# Patient Record
Sex: Male | Born: 1990 | Hispanic: Yes | Marital: Single | Smoking: Never smoker
Health system: Southern US, Community
[De-identification: ages and names within clinical notes are randomized; demographics above are authoritative.]

## PROBLEM LIST (undated history)

## (undated) DIAGNOSIS — T7840XA Allergy, unspecified, initial encounter: Secondary | ICD-10-CM

## (undated) HISTORY — DX: Allergy, unspecified, initial encounter: T78.40XA

---

## 2013-08-11 ENCOUNTER — Emergency Department (HOSPITAL_COMMUNITY)
Admission: EM | Admit: 2013-08-11 | Discharge: 2013-08-11 | Disposition: A | Discharge status: Home / Self Care | Payer: Self-pay | Attending: Emergency Medicine | Admitting: Emergency Medicine

## 2013-08-11 ENCOUNTER — Encounter (HOSPITAL_COMMUNITY): Payer: Self-pay

## 2013-08-11 DIAGNOSIS — IMO0002 Reserved for concepts with insufficient information to code with codable children: Secondary | ICD-10-CM | POA: Insufficient documentation

## 2013-08-11 DIAGNOSIS — X503XXA Overexertion from repetitive movements, initial encounter: Secondary | ICD-10-CM | POA: Insufficient documentation

## 2013-08-11 DIAGNOSIS — Y929 Unspecified place or not applicable: Secondary | ICD-10-CM | POA: Insufficient documentation

## 2013-08-11 DIAGNOSIS — Y990 Civilian activity done for income or pay: Secondary | ICD-10-CM | POA: Insufficient documentation

## 2013-08-11 DIAGNOSIS — Y9389 Activity, other specified: Secondary | ICD-10-CM | POA: Insufficient documentation

## 2013-08-11 DIAGNOSIS — M25512 Pain in left shoulder: Secondary | ICD-10-CM

## 2013-08-11 DIAGNOSIS — Z791 Long term (current) use of non-steroidal anti-inflammatories (NSAID): Secondary | ICD-10-CM | POA: Insufficient documentation

## 2013-08-11 DIAGNOSIS — T148XXA Other injury of unspecified body region, initial encounter: Secondary | ICD-10-CM

## 2013-08-11 MED ORDER — NAPROXEN 500 MG PO TABS: 500.0000 mg | ORAL_TABLET | Freq: Two times a day (BID) | ORAL | Status: AC

## 2013-08-11 NOTE — ED Notes (Addendum)
Pt c/o L shoulder pain x 2 days.  Sts he was working, lifting boxes and "it just started hurting."  Pain score 7/10.  Sts numbness in L elbow.  A & Ox4.  Pt only speaks a little [place].

## 2013-08-11 NOTE — ED Provider Notes (Signed)
Medical screening examination/treatment/procedure(s) were performed by non-physician practitioner and as supervising physician I was immediately available for consultation/collaboration.  Justo Hengel T Sakoya Win, MD 08/11/13 2306 

## 2013-08-11 NOTE — ED Provider Notes (Signed)
CSN: [id number]     Arrival date & time 08/11/13  1827 History   First MD Initiated Contact with Patient 08/11/13 2019     Chief Complaint  Patient presents with  . Shoulder Pain   (Consider location/radiation/quality/duration/timing/severity/associated sxs/prior Treatment) HPI Comments: 22 year old Hispanic male presents to the emergency department with a friend who is translating for him complaining of left shoulder pain beginning 2 days ago. Patient states he was lifting heavy boxes at work when the pain began, has been constant since. Pain described as burning, radiating down his arm to his elbow rated 8/10. He has tried taking Advil without relief. Denies neck pain, numbness or tingling. No prior injury to this shoulder.  Patient is a 22 y.o. male presenting with shoulder pain. The history is provided by the patient and a friend.  Shoulder Pain Pertinent negatives include no chest pain, chills, fever, joint swelling, numbness or weakness.    History reviewed. No pertinent past medical history. History reviewed. No pertinent past surgical history. History reviewed. No pertinent family history. History  Substance Use Topics  . Smoking status: Never Smoker   . Smokeless tobacco: Never Used  . Alcohol Use: No    Review of Systems  Constitutional: Negative for fever and chills.  Respiratory: Negative for shortness of breath.   Cardiovascular: Negative for chest pain.  Musculoskeletal: Negative for joint swelling.       Positive for left shoulder pain.  Skin: Negative for color change.  Neurological: Negative for weakness and numbness.  All other systems reviewed and are negative.    Allergies  Review of patient's allergies indicates no known allergies.  Home Medications   Current Outpatient Rx  Name  Route  Sig  Dispense  Refill  . ibuprofen (ADVIL,MOTRIN) 200 MG tablet   Oral   Take 200 mg by mouth every 6 (six) hours as needed for pain.         . naproxen (NAPROSYN)  500 MG tablet   Oral   Take 1 tablet (500 mg total) by mouth 2 (two) times daily.   30 tablet   0    BP 129/71  Pulse 57  Temp(Src) 98.6 F (37 C) (Oral)  Resp 20  SpO2 100% Physical Exam  Nursing note and vitals reviewed. Constitutional: He is oriented to person, place, and time. He appears well-developed and well-nourished. No distress.  HENT:  Head: Normocephalic and atraumatic.  Mouth/Throat: Oropharynx is clear and moist.  Eyes: Conjunctivae are normal.  Neck: Normal range of motion. Neck supple.  Full range of motion of C-spine. No C-spine or paraspinal muscle tenderness.  Cardiovascular: Normal rate, regular rhythm and normal heart sounds.   Pulmonary/Chest: Effort normal and breath sounds normal.  Musculoskeletal: Normal range of motion. He exhibits no edema.  Tender to palpation of anterior aspect of left shoulder. Tender to palpation of left deltoid and trapezius muscle. No deformity, swelling or bruising. Full range of motion of left shoulder, pain is noted with flexion and abduction. No bony tenderness  Neurological: He is alert and oriented to person, place, and time.  Strength upper extremity 5 out of 5 and equal bilateral. Sensation intact.  Skin: Skin is warm and dry. He is not diaphoretic.  Psychiatric: He has a normal mood and affect. His behavior is normal.    ED Course  Procedures (including critical care time) Labs Review Labs Reviewed - No data to display Imaging Review No results found.  MDM   1. Shoulder pain, left  2. Muscle strain    Patient with left shoulder pain. No deformity, swelling, bony tenderness. Tenderness to anterior aspect of shoulder along with deltoid and trapezius. Diagnosis of muscle strain. Advised continued use of NSAIDs, prescription for naproxen given. Also discussed importance of rest, ice/heat. Return precautions discussed. Patient states understanding of plan and is agreeable.   Trevor Mace, PA-C 08/11/13 2112

## 2014-05-31 ENCOUNTER — Ambulatory Visit: Payer: Self-pay

## 2014-05-31 ENCOUNTER — Other Ambulatory Visit: Payer: Self-pay | Admitting: Occupational Medicine

## 2014-05-31 DIAGNOSIS — R52 Pain, unspecified: Secondary | ICD-10-CM

## 2017-10-02 ENCOUNTER — Ambulatory Visit (INDEPENDENT_AMBULATORY_CARE_PROVIDER_SITE_OTHER): Payer: Worker's Compensation

## 2017-10-02 ENCOUNTER — Encounter: Payer: Self-pay | Admitting: Physician Assistant

## 2017-10-02 ENCOUNTER — Ambulatory Visit (INDEPENDENT_AMBULATORY_CARE_PROVIDER_SITE_OTHER): Payer: Worker's Compensation | Admitting: Physician Assistant

## 2017-10-02 VITALS — BP 144/82 | HR 75 | Temp 98.9°F | Resp 18 | Ht 64.45 in | Wt 168.2 lb

## 2017-10-02 DIAGNOSIS — Z23 Encounter for immunization: Secondary | ICD-10-CM

## 2017-10-02 DIAGNOSIS — S61213A Laceration without foreign body of left middle finger without damage to nail, initial encounter: Secondary | ICD-10-CM

## 2017-10-02 NOTE — Patient Instructions (Addendum)
  WOUND CARE Please return in 2 days to have your wound reexamined or sooner if you have concerns. Marland Kitchen. Keep area clean and dry for 24 hours. Do not remove bandage, if applied. . After 24 hours, remove bandage and wash wound gently with mild soap and warm water. Reapply a new bandage after cleaning wound, if directed. . Continue daily cleansing with soap and water until stitches/staples are removed. . Do not apply any ointments or creams to the wound while stitches/staples are in place, as this may cause delayed healing. . Notify the office if you experience any of the following signs of infection: Swelling, redness, pus drainage, streaking, fever >101.0 F . Notify the office if you experience excessive bleeding that does not stop after 15-20 minutes of constant, firm pressure.    IF you received an x-ray today, you will receive an invoice from [facility]. Please contact [facility] at [phone] with questions or concerns regarding your invoice.   IF you received labwork today, you will receive an invoice from [facility]. Please contact [facility] at [phone] with questions or concerns regarding your invoice.   Our billing staff will not be able to assist you with questions regarding bills from these companies.  You will be contacted with the lab results as soon as they are available. The fastest way to get your results is to activate your My Chart account. Instructions are located on the last page of this paperwork. If you have not heard from us regarding the results in 2 weeks, please contact this office.

## 2017-10-02 NOTE — Progress Notes (Signed)
    MRN: [id number] DOB: 04/28/1991  Subjective:   Cory Martinez is a 26 y.o. male presenting for chief complaint of Laceration (X today W/C- left hand middle finger, with nail gun at work)  Reports nail gun injury to left 3rd digit a few hours prior to arrival. He immediately wrapped it up. Has associated pain and some tingling in the finger. He is not sure if he can bend his finger fully because it is so painful. Denies dizziness, lightheadedness, nausea, and vomiting . He is not up to date on tdap vaccine. He is not on any anticoagulation medication.   He is accompanied by girlfriend, who is translating for patient.   Yousif's medications list, allergies, past medical history and past surgical history were reviewed and excluded from this note due to being a worker's comp case.   Objective:   Vitals: BP (!) 144/82 (BP Location: Left Arm, Patient Position: Sitting, Cuff Size: Normal)   Pulse 75   Temp 98.9 F (37.2 C) (Oral)   Resp 18   Ht 5' 4.45" (1.637 m)   Wt 168 lb 3.2 oz (76.3 kg)   SpO2 99%   BMI 28.47 kg/m   Physical Exam  Constitutional: He is oriented to person, place, and time. He appears well-developed and well-nourished. He appears distressed (in pain sitting on exam table with finger wrapped in bloody cloth).  HENT:  Head: Normocephalic and atraumatic.  Eyes: Conjunctivae are normal.  Neck: Normal range of motion.  Cardiovascular:  Pulses:      Radial pulses are 2+ on the right side, and 2+ on the left side.  Pulmonary/Chest: Effort normal.  Musculoskeletal:       Left hand: He exhibits tenderness (with palpation of most distal aspect of left 3rd digit). He exhibits normal range of motion (of DIP or PIP joints of left 3rd digit) and normal capillary refill. Normal sensation noted. Decreased strength (due to pain elicited) noted.  Neurological: He is alert and oriented to person, place, and time.  Skin: Skin is warm and dry.     Psychiatric: He has a normal mood  and affect.  Vitals reviewed.   Dg Finger Middle Left  Result Date: 10/02/2017 CLINICAL DATA:  Nail gun injury EXAM: LEFT MIDDLE FINGER 2+V COMPARISON:  None. FINDINGS: No definite acute displaced fracture or malalignment. No radiopaque foreign body. Soft tissue laceration distal third digit. IMPRESSION: Soft tissue laceration distal third digit. No definite acute osseous abnormality. Electronically Signed   By: Jasmine PangKim  Fujinaga M.D.   On: 10/02/2017 14:21    PROCEDURE NOTE: laceration repair Verbal consent obtained from patient.  Digital block with 3cc Lidocaine 2% without epinephrine.  Wound explored for tendon, ligament damage. Wound scrubbed with soap and water and rinsed. Wound closed with #12 5-0 Ethilon simple interrupted sutures.  Wound cleansed and dressed. Pt tolerated well.   Assessment and Plan :  1. Laceration of left middle finger without foreign body, nail damage status unspecified, initial encounter Wound repaired. He is neurovascularly intact. After care instructions given to patient. Would like to see the patient back in two days for reevaluation. Work restrictions to remain off work until he returns in 2 days for follow up. Educated to return sooner if he develops any signs of infection.  - DG Finger Middle Left; Future - Tdap vaccine greater than or equal to 7yo IM - Ethanol  Benjiman CoreBrittany Jacque Byron, PA-C  Primary Care at [facility] 10/05/2017 2:54 PM

## 2017-10-03 LAB — ETHANOL: Ethanol: NEGATIVE %

## 2017-10-04 ENCOUNTER — Encounter: Payer: Self-pay | Admitting: Physician Assistant

## 2017-10-04 ENCOUNTER — Ambulatory Visit (INDEPENDENT_AMBULATORY_CARE_PROVIDER_SITE_OTHER): Payer: Worker's Compensation | Admitting: Physician Assistant

## 2017-10-04 VITALS — BP 130/70 | HR 97 | Temp 98.9°F | Resp 18 | Ht 64.45 in | Wt 169.6 lb

## 2017-10-04 DIAGNOSIS — S61213D Laceration without foreign body of left middle finger without damage to nail, subsequent encounter: Secondary | ICD-10-CM

## 2017-10-04 NOTE — Patient Instructions (Addendum)
   Your wound is healing well.  I recommend continuing to keep it covered while out and about with a Band-Aid and [place] like you are.  Continue to wash gently with soap and water.  Plan to follow-up in 1 week for suture removal.  I advised taking ibuprofen as prescribed before this visit.  Please return sooner if you develop any signs of infection. Thank you for letting me participate in your health and well being.   IF you received an x-ray today, you will receive an invoice from [facility]. Please contact [facility] at [phone] with questions or concerns regarding your invoice.   IF you received labwork today, you will receive an invoice from [facility]. Please contact [facility] at [phone] with questions or concerns regarding your invoice.   Our billing staff will not be able to assist you with questions regarding bills from these companies.  You will be contacted with the lab results as soon as they are available. The fastest way to get your results is to activate your My Chart account. Instructions are located on the last page of this paperwork. If you have not heard from us regarding the results in 2 weeks, please contact this office.

## 2017-10-04 NOTE — Progress Notes (Signed)
    MRN: [id number] DOB: 03/22/1991  Subjective:   Cory Martinez is a 26 y.o. male presenting for chief complaint of Hand Pain (finger injury follow up )  Patient was initially seen on 10/02/2017 for laceration to tip of third digit of left hand.  Plain films were negative.  Laceration was repaired.  Due to extensive involvement of the tip of the finger, patient was informed to return in 2 days for reevaluation.  Today he notes that he is kept covered as instructed.  He has washed gently with soap and water.  He denies any purulent drainage, redness, warmth, and loss of range of motion.  He has still had some mild discomfort, especially with hand movements.  Does endorse some mild numbness sensation in the tip of his finger.  Denies tingling.  Johndaniel's medications list, allergies, past medical history and past surgical history were reviewed and excluded from this note due to being a worker's comp case.   Objective:   Vitals: BP 130/70   Pulse 97   Temp 98.9 F (37.2 C) (Oral)   Resp 18   Ht 5' 4.45" (1.637 m)   Wt 169 lb 9.6 oz (76.9 kg)   SpO2 98%   BMI 28.71 kg/m   Physical Exam  Constitutional: He is oriented to person, place, and time. He appears well-developed and well-nourished.  HENT:  Head: Normocephalic and atraumatic.  Eyes: Conjunctivae are normal.  Neck: Normal range of motion.  Pulmonary/Chest: Effort normal.  Neurological: He is alert and oriented to person, place, and time.  Skin: Skin is warm and dry.     Psychiatric: He has a normal mood and affect.  Vitals reviewed.    No results found for this or any previous visit (from the past 24 hour(s)).  Assessment and Plan :  1. Laceration of left middle finger without foreign body, nail damage status unspecified, subsequent encounter Well-healing wound.  Bandage applied in office.  Continue daily gentle cleansing with soap and water.  Keep covered.  Recommend follow-up in 7 days for suture removal.  Return sooner  if signs of infection develop.   [name], PA-C  Primary Care at [facility] 10/04/2017 11:22 AM

## 2017-10-11 ENCOUNTER — Encounter: Payer: Self-pay | Admitting: Physician Assistant

## 2017-10-11 ENCOUNTER — Ambulatory Visit (INDEPENDENT_AMBULATORY_CARE_PROVIDER_SITE_OTHER): Payer: Worker's Compensation | Admitting: Physician Assistant

## 2017-10-11 VITALS — BP 122/78 | HR 68 | Temp 97.6°F | Resp 18 | Ht 67.8 in | Wt 163.0 lb

## 2017-10-11 DIAGNOSIS — S61213D Laceration without foreign body of left middle finger without damage to nail, subsequent encounter: Secondary | ICD-10-CM

## 2017-10-11 MED ORDER — IBUPROFEN 200 MG PO TABS
400.0000 mg | ORAL_TABLET | Freq: Once | ORAL | Status: AC
  Administered 2017-10-11: 400 mg via ORAL

## 2017-10-11 NOTE — Progress Notes (Deleted)
   Cory Martinez  MRN: [id number] DOB: 12/13/1990  Subjective:  Cory Martinez is a 26 y.o. male seen in office today for a chief complaint of ***  Review of Systems  There are no active problems to display for this patient.   Current Outpatient Prescriptions on File Prior to Visit  Medication Sig Dispense Refill  . ibuprofen (ADVIL,MOTRIN) 200 MG tablet Take 200 mg by mouth every 6 (six) hours as needed for pain.    . naproxen (NAPROSYN) 500 MG tablet Take 1 tablet (500 mg total) by mouth 2 (two) times daily. 30 tablet 0   No current facility-administered medications on file prior to visit.     No Known Allergies   Objective:  There were no vitals taken for this visit.  Physical Exam  Assessment and Plan :  *** There are no diagnoses linked to this encounter.   [name] PA-C  Primary Care at [facility] 10/11/2017 11:15 AM

## 2017-10-11 NOTE — Patient Instructions (Addendum)
  I recommend keeping the wound covered with a Band-Aid.  You may wash with soap and water daily.  I recommend avoiding use of the left hand at work at least for the next 4 days.  Follow-up with me on Wednesday of next week for reevaluation.  You may schedule appointment when you leave here today.  Return sooner if he develops any signs of infections.   IF you received an x-ray today, you will receive an invoice from [facility]. Please contact [facility] at [phone] with questions or concerns regarding your invoice.   IF you received labwork today, you will receive an invoice from [facility]. Please contact [facility] at [phone] with questions or concerns regarding your invoice.   Our billing staff will not be able to assist you with questions regarding bills from these companies.  You will be contacted with the lab results as soon as they are available. The fastest way to get your results is to activate your My Chart account. Instructions are located on the last page of this paperwork. If you have not heard from us regarding the results in 2 weeks, please contact this office.

## 2017-10-11 NOTE — Progress Notes (Signed)
   Patient: Cory Martinez [id number]  Subjective: Cory Martinez is returning for suture removal. Patient was initially seen 10/02/17 and had 12 sutures placed.  He is still having some pain at the tip of his finger.  Denies fever, drainage of pus or blood, wound dehiscence, edema, numbness and tingling.  Objective: Physical Exam  Constitutional: He is oriented to person, place, and time and well-developed, well-nourished, and in no distress.  HENT:  Head: Normocephalic and atraumatic.  Eyes: Conjunctivae are normal.  Neck: Normal range of motion.  Pulmonary/Chest: Effort normal.  Musculoskeletal:       Left hand: He exhibits normal capillary refill.  Neurological: He is alert and oriented to person, place, and time. Gait normal.  Skin: Skin is warm and dry.     Psychiatric: Affect normal.  Vitals reviewed.   #12 sutures removed without incident.  400 mg of ibuprofen given to patient due to pain experienced during suture removal.  Patient tolerated this well.  Band-Aid applied.  Assessment and Plan: 1. Laceration of left middle finger without foreign body, nail damage status unspecified, subsequent encounter Well-healed wound. Sutures removed. Anticipatory guidance provided.  I still encourage patient to avoid repetitive use of left hand for a few more days.  Recommend follow-up in 4 days for reevaluation. - ibuprofen (ADVIL,MOTRIN) tablet 400 mg; Take 2 tablets (400 mg total) by mouth once.  [name], PA-C  Primary Care at [facility] 10/11/2017 1:21 PM

## 2017-10-15 ENCOUNTER — Ambulatory Visit (INDEPENDENT_AMBULATORY_CARE_PROVIDER_SITE_OTHER): Payer: Worker's Compensation | Admitting: Physician Assistant

## 2017-10-15 ENCOUNTER — Encounter: Payer: Self-pay | Admitting: Physician Assistant

## 2017-10-15 ENCOUNTER — Other Ambulatory Visit: Payer: Self-pay

## 2017-10-15 VITALS — BP 136/72 | HR 82 | Temp 98.0°F | Resp 18 | Ht 64.57 in | Wt 171.0 lb

## 2017-10-15 DIAGNOSIS — S61213D Laceration without foreign body of left middle finger without damage to nail, subsequent encounter: Secondary | ICD-10-CM

## 2017-10-15 NOTE — Progress Notes (Signed)
    MRN: [id number] DOB: 06/10/1991  Subjective:   Cory Martinez is a 26 y.o. male presenting for chief complaint of follow-up on Laceration (f/u left middle finger)  His sutures were removed on 10/11/2017.  Work restrictions were given because he was still having pain.  Today he notes he is doing much better. Denies pain, fever, drainage of pus or blood, wound dehiscence, edema, numbness and tingling.  He believes he can return to work with no restrictions at this time.  Dacen's medications list, allergies, past medical history and past surgical history were reviewed and excluded from this note due to being a worker's comp case.   Objective:   Vitals: BP 136/72 (BP Location: Left Arm, Patient Position: Sitting, Cuff Size: Normal)   Pulse 82   Temp 98 F (36.7 C) (Oral)   Resp 18   Ht 5' 4.57" (1.64 m)   Wt 171 lb (77.6 kg)   SpO2 99%   BMI 28.84 kg/m   Physical Exam  Constitutional: He is oriented to person, place, and time. He appears well-developed and well-nourished.  HENT:  Head: Normocephalic and atraumatic.  Eyes: Conjunctivae are normal.  Neck: Normal range of motion.  Pulmonary/Chest: Effort normal.  Neurological: He is alert and oriented to person, place, and time.  Skin: Skin is warm and dry.     Psychiatric: He has a normal mood and affect.  Vitals reviewed.      No results found for this or any previous visit (from the past 24 hour(s)).  Assessment and Plan :  1. Laceration of left middle finger without foreign body, nail damage status unspecified, subsequent encounter Wound is healing well.  Strength and sensation intact.  I believe patient can be released back to to work with no restrictions at this time.  Encouraged to cntinue to keep wound covered with a Band-Aid while at work. Return to clinic if symptoms worsen or as needed.  Benjiman CoreBrittany Iann Rodier, PA-C  Primary Care at [facility] 10/15/2017 2:44 PM

## 2017-10-15 NOTE — Progress Notes (Deleted)
   Cory Martinez  MRN: [id number] DOB: 11/23/1991  Subjective:  Cory Martinez is a 26 y.o. male seen in office today for a chief complaint of ***  Review of Systems  There are no active problems to display for this patient.   Current Outpatient Medications on File Prior to Visit  Medication Sig Dispense Refill  . ibuprofen (ADVIL,MOTRIN) 200 MG tablet Take 200 mg by mouth every 6 (six) hours as needed for pain.    . naproxen (NAPROSYN) 500 MG tablet Take 1 tablet (500 mg total) by mouth 2 (two) times daily. 30 tablet 0   No current facility-administered medications on file prior to visit.     No Known Allergies   Objective:  There were no vitals taken for this visit.  Physical Exam  Assessment and Plan :  *** There are no diagnoses linked to this encounter.   [name] PA-C  [facility] at [facility] 10/15/2017 1:43 PM

## 2017-10-15 NOTE — Patient Instructions (Addendum)
Your wound is healing well! You may return to work with no restrictions.  Please keep your wound covered until it is fully healed. Thank you for letting me participate in your health and well being.     IF you received an x-ray today, you will receive an invoice from [facility]. Please contact [facility] at [phone] with questions or concerns regarding your invoice.   IF you received labwork today, you will receive an invoice from [facility]. Please contact [facility] at [phone] with questions or concerns regarding your invoice.   Our billing staff will not be able to assist you with questions regarding bills from these companies.  You will be contacted with the lab results as soon as they are available. The fastest way to get your results is to activate your My Chart account. Instructions are located on the last page of this paperwork. If you have not heard from us regarding the results in 2 weeks, please contact this office.

## 2019-06-17 IMAGING — DX DG FINGER MIDDLE 2+V*L*
3 series · 3 of 3 positions shown · non-contrast
Comparison: None.

CLINICAL DATA: Nail gun injury

EXAM:
LEFT MIDDLE FINGER 2+V

[finger ap]
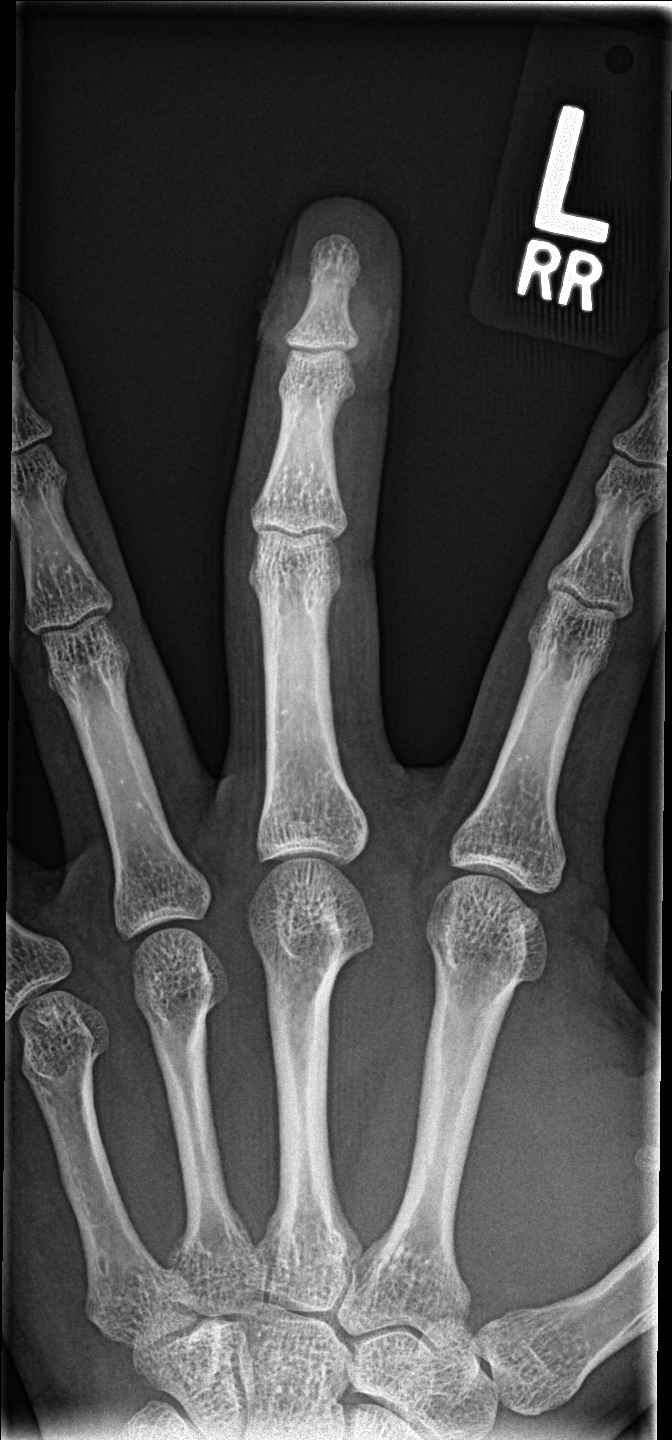

[finger obl]
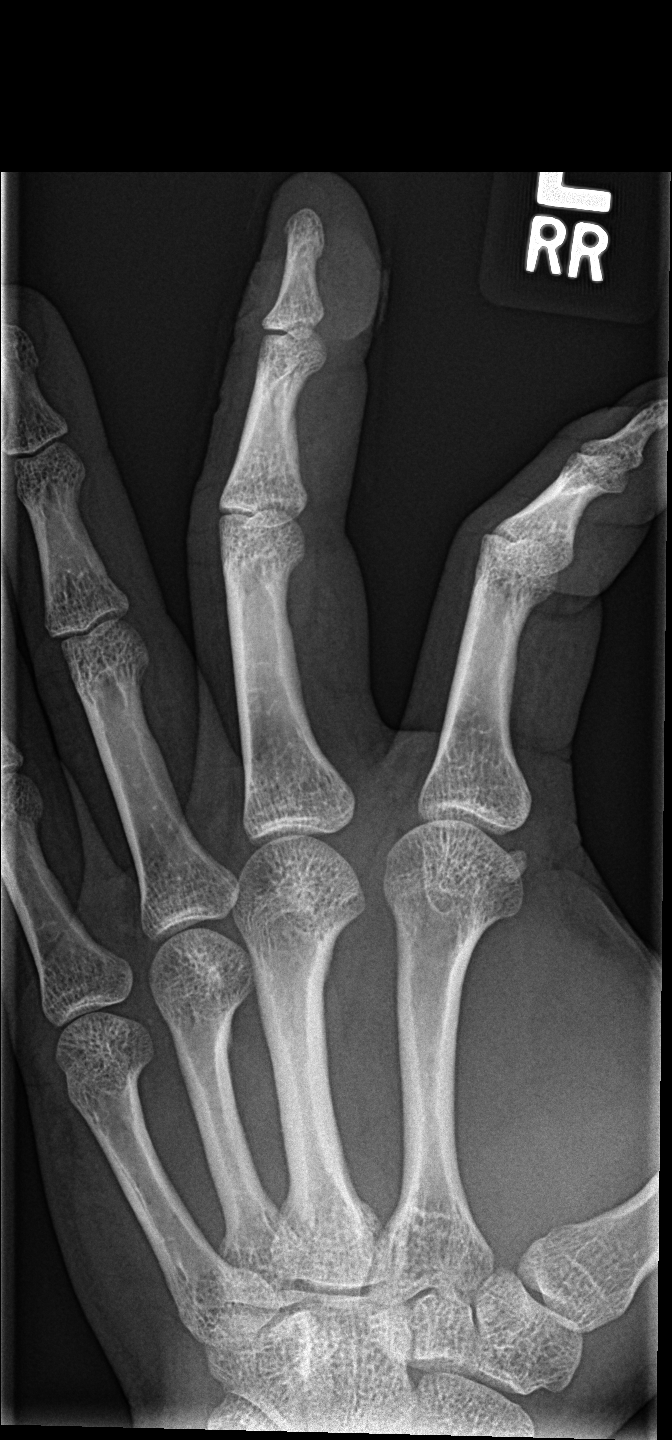

[finger lat]
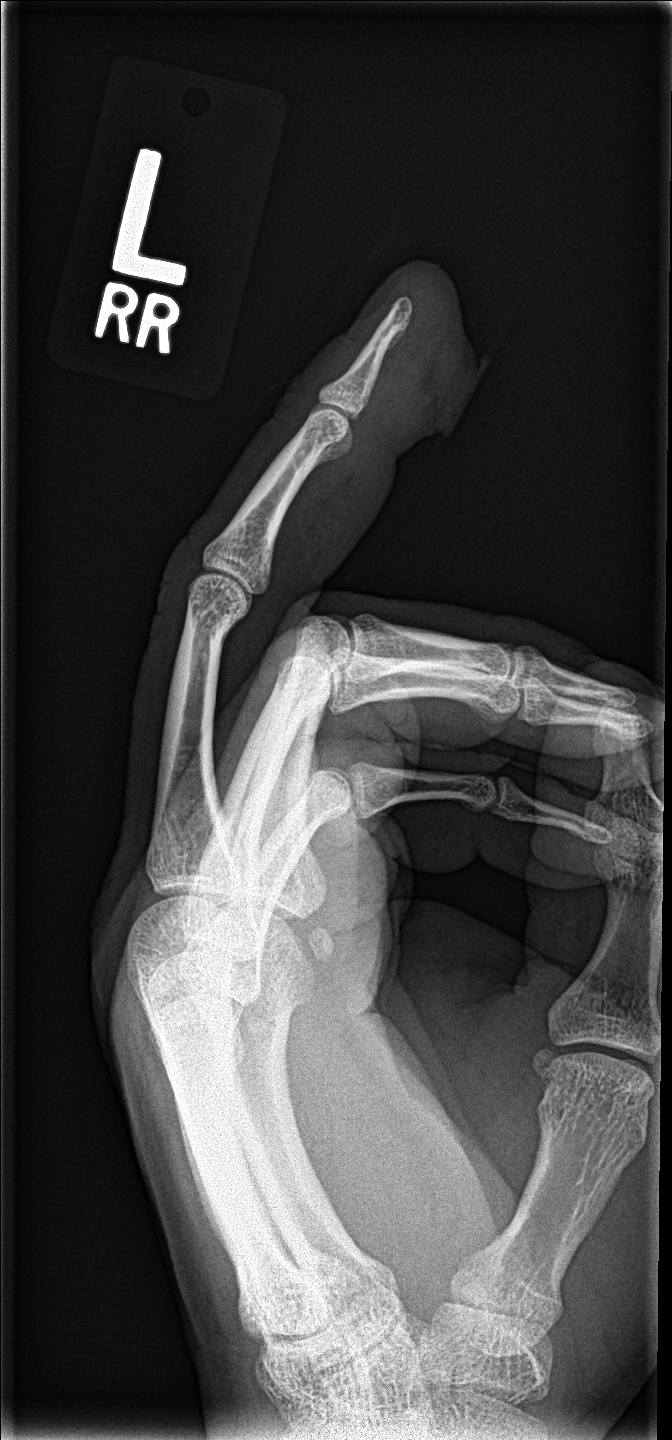

[3 of 3 positions shown; findings below may reference images not displayed]

FINDINGS: No definite acute displaced fracture or malalignment. No radiopaque
foreign body. Soft tissue laceration distal third digit.
IMPRESSION: Soft tissue laceration distal third digit. No definite acute osseous
abnormality.
